# Patient Record
Sex: Male | Born: 2000 | Race: Black or African American | Hispanic: No | Marital: Single | Smoking: Never smoker
Health system: Southern US, Community
[De-identification: ages and names within clinical notes are randomized; demographics above are authoritative.]

## PROBLEM LIST (undated history)

## (undated) DIAGNOSIS — T300 Burn of unspecified body region, unspecified degree: Secondary | ICD-10-CM

## (undated) HISTORY — PX: SKIN GRAFT: SHX250

---
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
## 2018-02-15 IMAGING — CR DG CHEST 2V
2 series · 2 of 2 positions shown · non-contrast
Comparison: None.

CLINICAL DATA: Sudden onset of chest pain and heart palpitations.

EXAM:
CHEST  2 VIEW

[w chest pa]
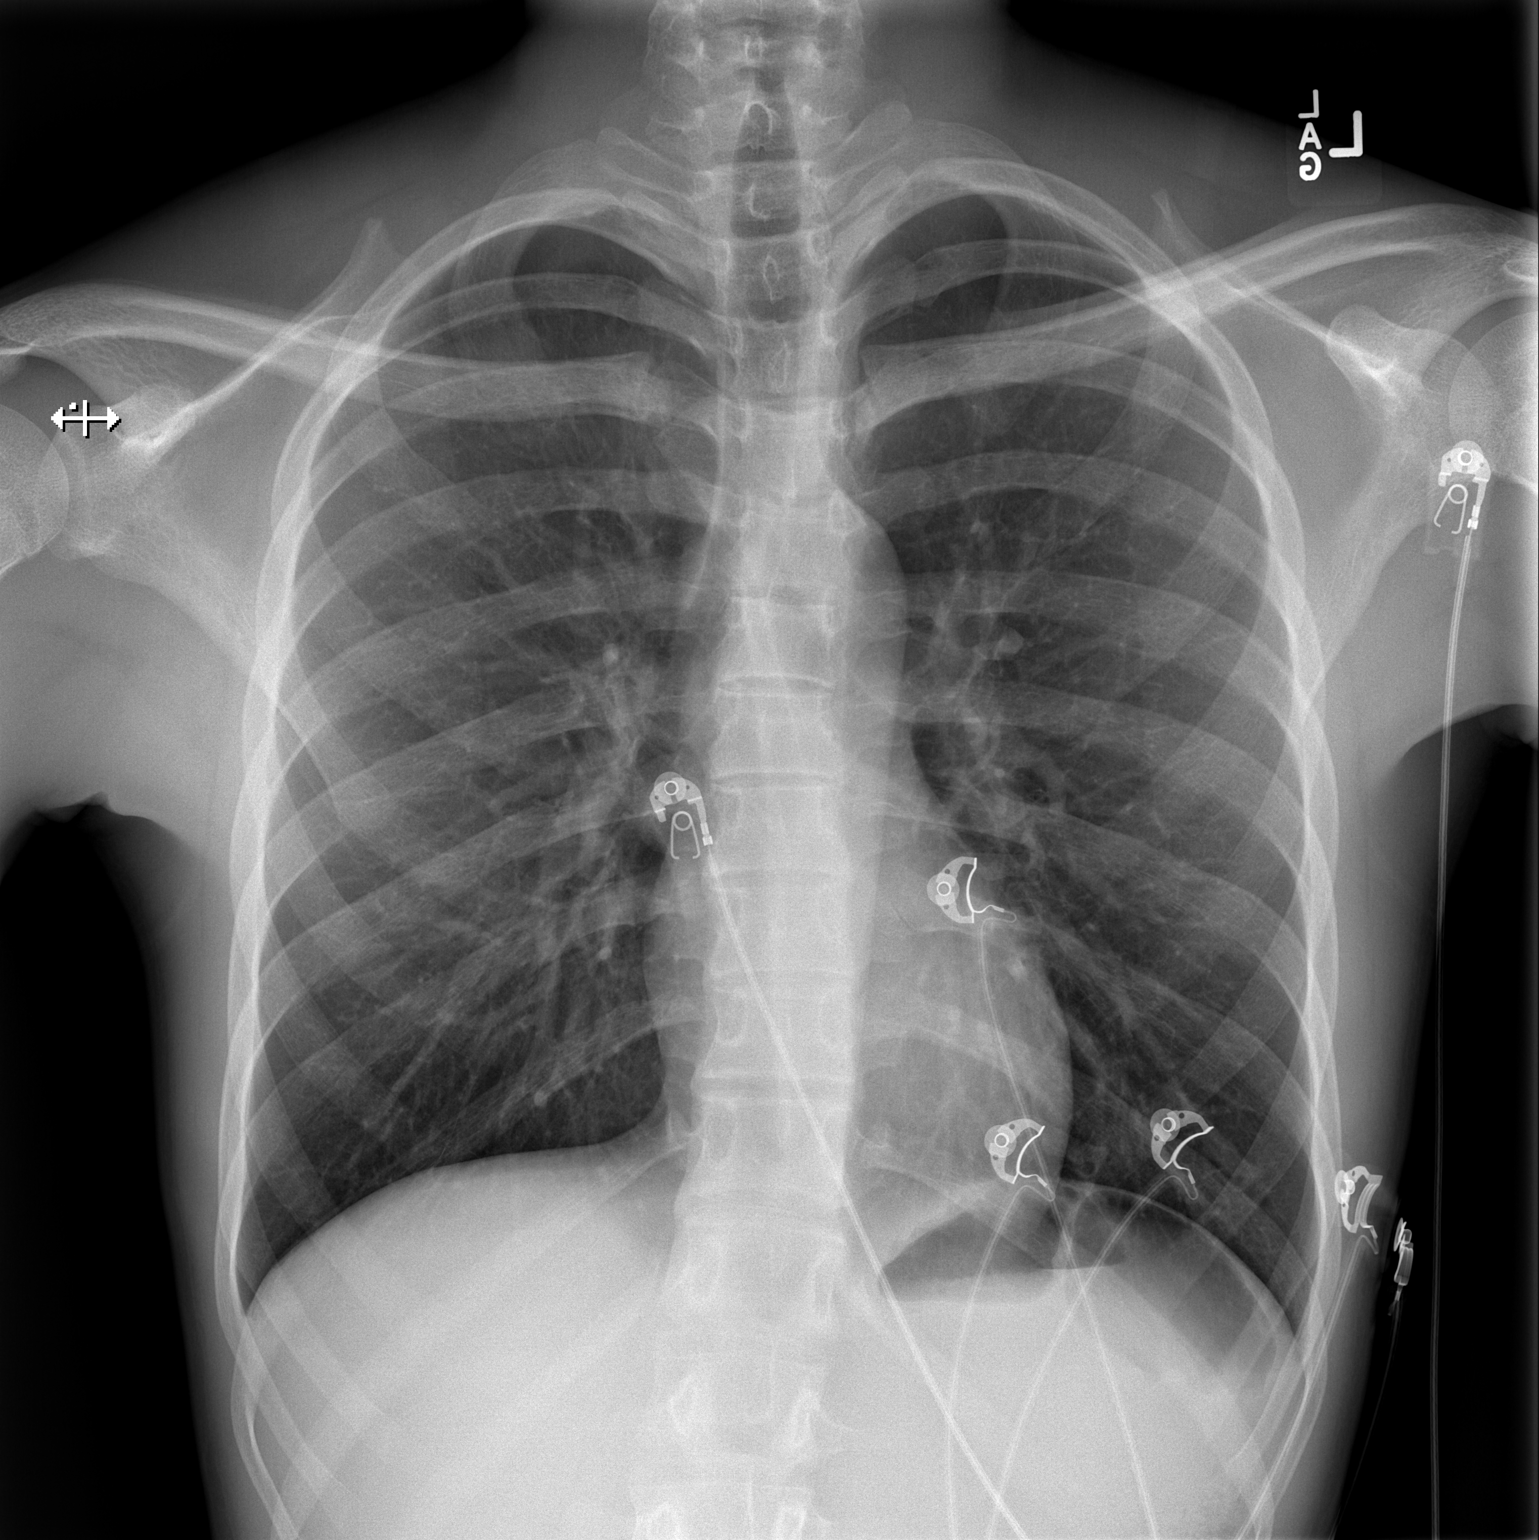

[w chest lat]
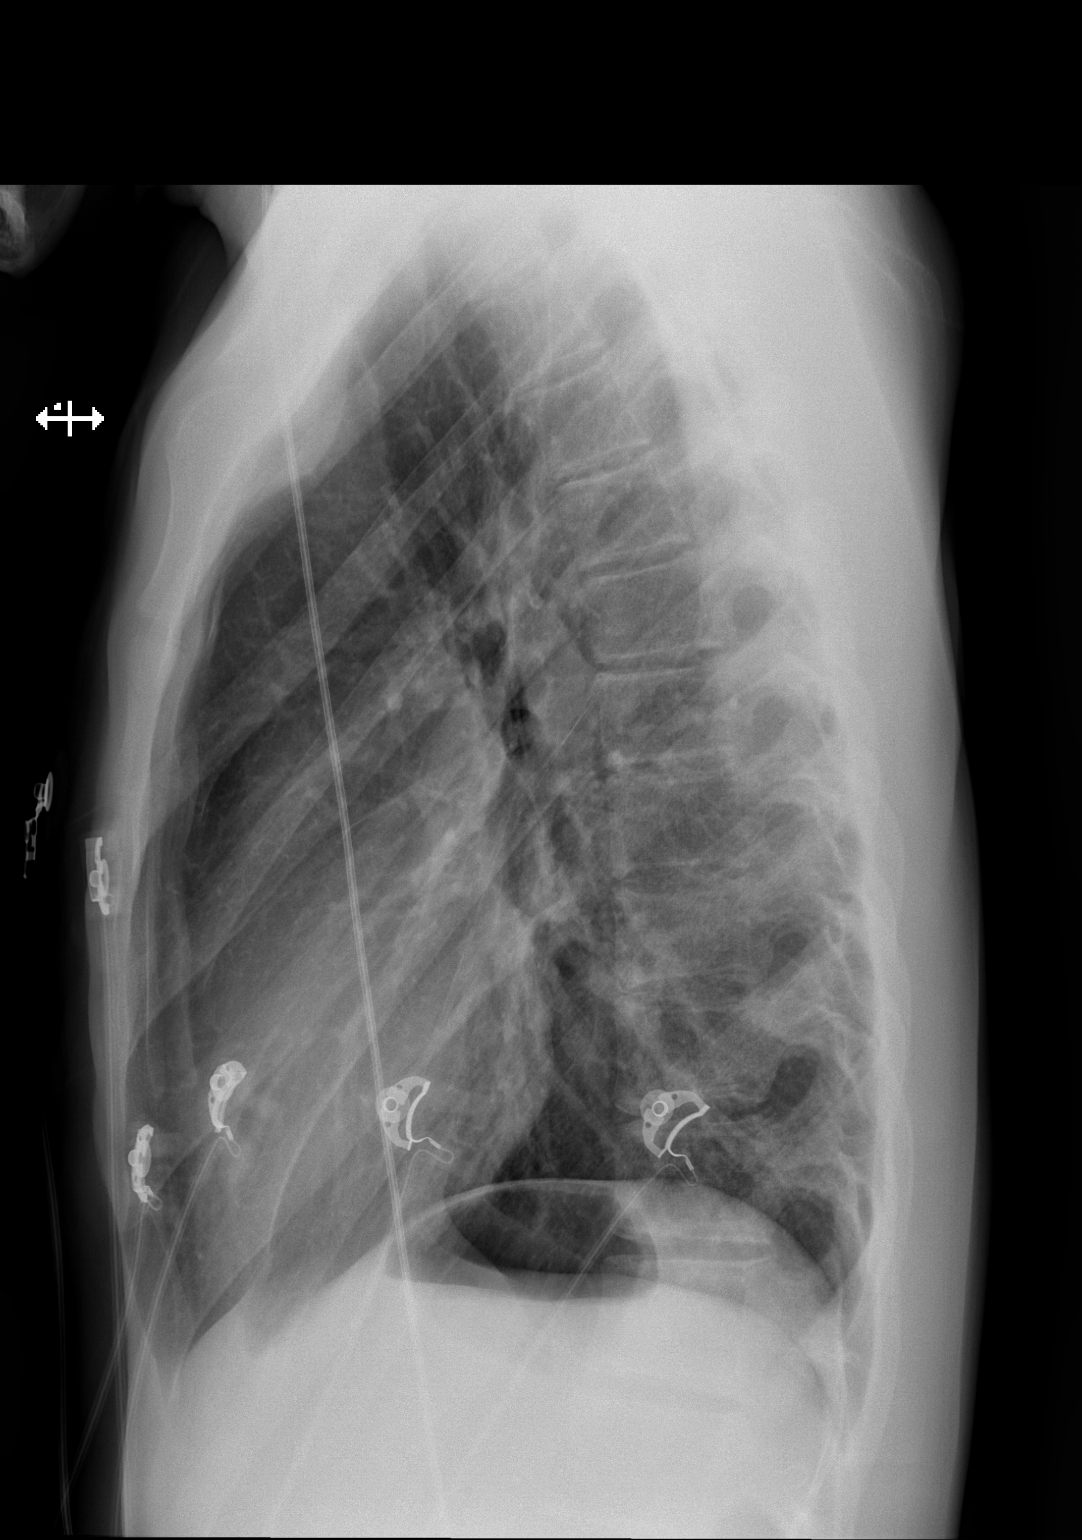

[2 of 2 positions shown; findings below may reference images not displayed]

FINDINGS: Mild hyperinflation. The heart size and mediastinal contours are
within normal limits. Both lungs are clear. The visualized skeletal
structures are unremarkable.
IMPRESSION: No active cardiopulmonary disease.
